# Patient Record
Sex: Male | Born: 1970 | Hispanic: No | Marital: Married
Health system: Southern US, Community
[De-identification: ages and names within clinical notes are randomized; demographics above are authoritative.]

---

## 2020-10-10 ENCOUNTER — Other Ambulatory Visit: Payer: 59

## 2020-10-10 DIAGNOSIS — Z20822 Contact with and (suspected) exposure to covid-19: Secondary | ICD-10-CM

## 2020-10-13 LAB — SARS-COV-2, NAA 2 DAY TAT

## 2020-10-13 LAB — NOVEL CORONAVIRUS, NAA: SARS-CoV-2, NAA: DETECTED — AB

## 2021-02-14 ENCOUNTER — Ambulatory Visit: Payer: Self-pay

## 2021-02-14 ENCOUNTER — Ambulatory Visit (INDEPENDENT_AMBULATORY_CARE_PROVIDER_SITE_OTHER): Payer: 59 | Admitting: Orthopaedic Surgery

## 2021-02-14 ENCOUNTER — Other Ambulatory Visit: Payer: Self-pay

## 2021-02-14 DIAGNOSIS — M79671 Pain in right foot: Secondary | ICD-10-CM

## 2021-02-14 MED ORDER — MELOXICAM 7.5 MG PO TABS: 7.5000 mg | ORAL_TABLET | Freq: Two times a day (BID) | ORAL | 2 refills | Status: AC | PRN

## 2021-02-14 MED ORDER — NITROGLYCERIN 0.2 MG/HR TD PT24: MEDICATED_PATCH | TRANSDERMAL | 12 refills | Status: AC

## 2021-02-14 NOTE — Progress Notes (Signed)
Office Visit Note   Patient: Steve Martin           Date of Birth: 12/25/1970           MRN: [id number] Visit Date: 02/14/2021              Requested by: No referring provider defined for this encounter. PCP: No primary care provider on file.   Assessment & Plan: Visit Diagnoses:  1. Pain of right heel     Plan: Based on findings impression is chronic right insertional Achilles tendinopathy.  Symptoms mainly seem to correlate with running and overactivity.  Fortunately no real pain with normal walking or daily activities.  He is able to wear regular shoes without problems.  We talked about conservative management and he would like to try meloxicam, nitroglycerin patches, home stretches and exercises.  Pennsaid sample was provided today.  I showed him pictures of night splints that he may want to get from [employer].  Follow-Up Instructions: Return if symptoms worsen or fail to improve.   Orders:  Orders Placed This Encounter  Procedures  . XR Os Calcis Right   Meds ordered this encounter  Medications  . nitroGLYCERIN (NITRODUR - DOSED IN MG/24 HR) 0.2 mg/hr patch    Sig: Apply 1/4 patch to area daily prn    Dispense:  5 patch    Refill:  12  . meloxicam (MOBIC) 7.5 MG tablet    Sig: Take 1 tablet (7.5 mg total) by mouth 2 (two) times daily as needed for pain.    Dispense:  30 tablet    Refill:  2      Procedures: No procedures performed   Clinical Data: No additional findings.   Subjective: Chief Complaint  Patient presents with  . Other     Right heel and hip pain    Patient is 50 year old gentleman with chronic right heel pain worse after running and exercising for about 2 years.  He is the father of Bland Rudzinski who is a Producer, television/film/video at [employer].  This is better with rest.  He runs about 5 to 7 miles each time a couple times a week.  The pain is directly posterior that is worse first thing in the morning after running the prior day.  Denies any  injuries.  He does have swelling.  Pain seems to be relatively mild or nonexistent with daily activities.  He denies any pain to touch   Review of Systems   Objective: Vital Signs: There were no vitals taken for this visit.  Physical Exam  Ortho Exam Right heel shows swelling and tenderness to the insertion of the calcaneus consistent with insertional calcaneal tendinopathy.  He has good strength to ankle plantarflexion.  Good passive ankle dorsiflexion.  Posterior tibial tendon, tarsal tunnel, plantar fascial nontender.  Specialty Comments:  No specialty comments available.  Imaging: XR Os Calcis Right  Result Date: 02/14/2021 Calcaneal spur consistent with chronic insertional achilles tendinopathy    PMFS History: There are no problems to display for this patient.  No past medical history on file.  No family history on file.   Social History   Occupational History  . Not on file  Tobacco Use  . Smoking status: Not on file  . Smokeless tobacco: Not on file  Substance and Sexual Activity  . Alcohol use: Not on file  . Drug use: Not on file  . Sexual activity: Not on file

## 2021-12-15 ENCOUNTER — Ambulatory Visit (INDEPENDENT_AMBULATORY_CARE_PROVIDER_SITE_OTHER): Payer: 59

## 2021-12-15 ENCOUNTER — Other Ambulatory Visit: Payer: Self-pay

## 2021-12-15 ENCOUNTER — Encounter: Payer: Self-pay | Admitting: Orthopaedic Surgery

## 2021-12-15 ENCOUNTER — Ambulatory Visit (INDEPENDENT_AMBULATORY_CARE_PROVIDER_SITE_OTHER): Payer: 59 | Admitting: Orthopaedic Surgery

## 2021-12-15 DIAGNOSIS — M545 Low back pain, unspecified: Secondary | ICD-10-CM

## 2021-12-15 DIAGNOSIS — M25551 Pain in right hip: Secondary | ICD-10-CM

## 2021-12-15 MED ORDER — PREDNISONE 10 MG (21) PO TBPK: ORAL_TABLET | ORAL | 3 refills | Status: AC

## 2021-12-15 MED ORDER — TRAMADOL HCL 50 MG PO TABS: 50.0000 mg | ORAL_TABLET | Freq: Every evening | ORAL | 0 refills | Status: AC | PRN

## 2021-12-15 NOTE — Progress Notes (Signed)
? ?Office Visit Note ?  ?Patient: Steve Martin           ?Date of Birth: 1971-08-25           ?MRN: [id number] ?Visit Date: 12/15/2021 ?             ?Requested by: No referring provider defined for this encounter. ?PCP: No primary care provider on file. ? ? ?Assessment & Plan: ?Visit Diagnoses:  ?1. Low back pain, unspecified back pain laterality, unspecified chronicity, unspecified whether sciatica present   ?2. Pain in right hip   ? ? ?Plan: Based on findings unclear etiology as to the buttock pain.  Likely this is muscular and overuse in nature.  He may have a separate groin strain or hip flexor tendinitis either from overactivity or compensation from the buttock pain.  Based on options we will try prednisone Dosepak and relative rest.  I sent in a small supply of tramadol to take for more severe pain at nighttime.  He will let me know if he does not feel any improvement. ? ?Follow-Up Instructions: No follow-ups on file.  ? ?Orders:  ?Orders Placed This Encounter  ?Procedures  ? XR HIP UNILAT W OR W/O PELVIS 2-3 VIEWS RIGHT  ? XR Lumbar Spine 2-3 Views  ? ?Meds ordered this encounter  ?Medications  ? predniSONE (STERAPRED UNI-PAK 21 TAB) 10 MG (21) TBPK tablet  ?  Sig: Take as directed  ?  Dispense:  21 tablet  ?  Refill:  3  ? traMADol (ULTRAM) 50 MG tablet  ?  Sig: Take 1-2 tablets (50-100 mg total) by mouth at bedtime as needed.  ?  Dispense:  10 tablet  ?  Refill:  0  ? ? ? ? Procedures: ?No procedures performed ? ? ?Clinical Data: ?No additional findings. ? ? ?Subjective: ?Chief Complaint  ?Patient presents with  ? Right Hip - Pain  ? Lower Back - Pain  ? ? ?HPI ? ?Patient is a very pleasant 51 year old gentleman who I know socially who comes in for right leg pain for about 6 weeks.  He denies any true back pain.  He states that the majority of the pain is in the muscular buttock region that will radiate down the lateral aspect of the thigh to the knee when he is driving a car.  Occasionally it radiates into  the groin but it feels muscular and it feels like a separate issue.  Denies any injuries.  PCP placed him on Celebrex for about 3 weeks which he only takes at night and he feels like there is some temporary relief.  He does not feel like this is getting better.  Denies any numbness and tingling. ? ?Review of Systems  ?Constitutional: Negative.   ?All other systems reviewed and are negative. ? ? ?Objective: ?Vital Signs: There were no vitals taken for this visit. ? ?Physical Exam ?Vitals and nursing note reviewed.  ?Constitutional:   ?   Appearance: He is well-developed.  ?Pulmonary:  ?   Effort: Pulmonary effort is normal.  ?Abdominal:  ?   Palpations: Abdomen is soft.  ?Skin: ?   General: Skin is warm.  ?Neurological:  ?   Mental Status: He is alert and oriented to person, place, and time.  ?Psychiatric:     ?   Behavior: Behavior normal.     ?   Thought Content: Thought content normal.     ?   Judgment: Judgment normal.  ? ? ?Ortho Exam ? ?  Examination of the right hip shows excellent range of motion.  He does have some groin pain with FADIR test and Pearlean Brownie.  Axial compression is negative.  No pain in the SI joint with FABER.  Lateral hip is nontender.  No sciatic tension signs.  He has some slight groin pain with resisted straight leg test.  Slight groin pain with external rotation logroll.  Lumbar spine shows normal range of motion and no tenderness or palpable defects or abnormalities. ? ?Specialty Comments:  ?No specialty comments available. ? ?Imaging: ?XR HIP UNILAT W OR W/O PELVIS 2-3 VIEWS RIGHT ? ?Result Date: 12/15/2021 ?Mild superior acetabular spurring.  Well-preserved joint space. ? ?XR Lumbar Spine 2-3 Views ? ?Result Date: 12/15/2021 ?No acute or structural abnormalities.  Mild lumbar spondylosis.  Nothing significantly degenerative.  ? ? ?PMFS History: ?There are no problems to display for this patient. ? ?History reviewed. No pertinent past medical history.  ?History reviewed. No pertinent family  history.  ?History reviewed. No pertinent surgical history. ?Social History  ? ?Occupational History  ? Not on file  ?Tobacco Use  ? Smoking status: Not on file  ? Smokeless tobacco: Not on file  ?Substance and Sexual Activity  ? Alcohol use: Not on file  ? Drug use: Not on file  ? Sexual activity: Not on file  ? ? ? ? ? ? ?

## 2021-12-26 ENCOUNTER — Ambulatory Visit: Payer: 59 | Admitting: Orthopaedic Surgery

## 2022-01-25 ENCOUNTER — Other Ambulatory Visit: Payer: Self-pay

## 2022-01-25 DIAGNOSIS — M25551 Pain in right hip: Secondary | ICD-10-CM

## 2022-01-25 DIAGNOSIS — M545 Low back pain, unspecified: Secondary | ICD-10-CM

## 2022-02-03 ENCOUNTER — Ambulatory Visit
Admission: RE | Admit: 2022-02-03 | Discharge: 2022-02-03 | Disposition: A | Discharge status: Home / Self Care | Payer: 59 | Source: Ambulatory Visit | Attending: Orthopaedic Surgery | Admitting: Orthopaedic Surgery

## 2022-02-03 DIAGNOSIS — M25551 Pain in right hip: Secondary | ICD-10-CM

## 2022-02-03 DIAGNOSIS — M545 Low back pain, unspecified: Secondary | ICD-10-CM

## 2022-02-03 IMAGING — MR MR LUMBAR SPINE W/O CM
4 of 5 series · 27 of 48 positions shown · non-contrast
Comparison: Lumbar spine x-ray [DATE]

CLINICAL DATA: Right hip and back pain.  Ongoing for 4 months.

EXAM:
MRI LUMBAR SPINE WITHOUT CONTRAST
TECHNIQUE: Multiplanar, multisequence MR imaging of the lumbar spine was
performed. No intravenous contrast was administered.

[Series 2: T2 · sagittal · 4.0mm · 1.09mm/px · 6 of 17 slices shown (1 of 2)]
[im 1/17]
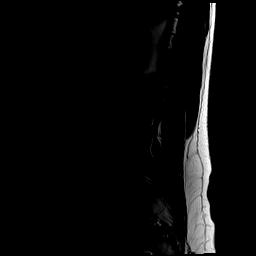
[im 4/17]
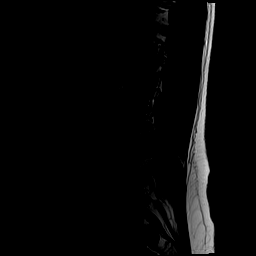
[im 7/17]
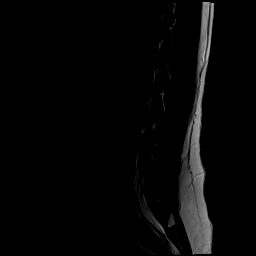
[im 10/17]
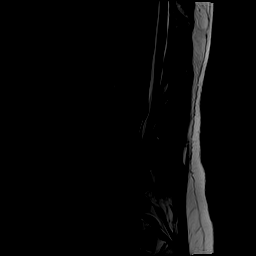
[im 13/17]
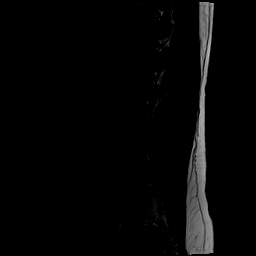
[im 17/17]
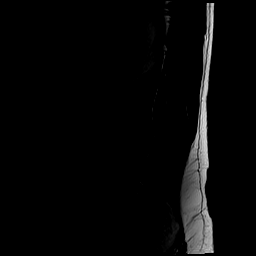

[Series 4: T1 · sagittal · 4.0mm · 1.09mm/px · 6 of 17 slices shown (1 of 2)]
[im 1/17]
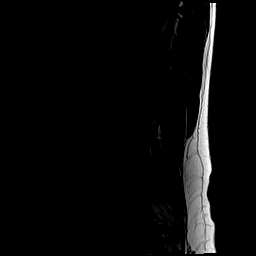
[im 4/17]
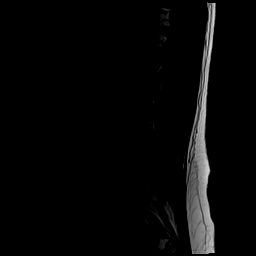
[im 7/17]
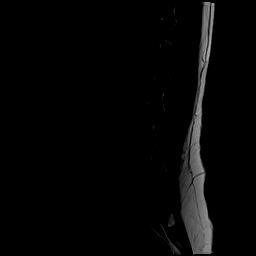
[im 10/17]
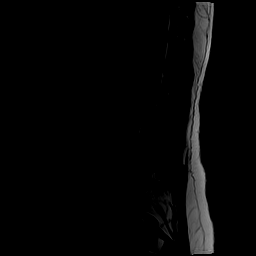
[im 13/17]
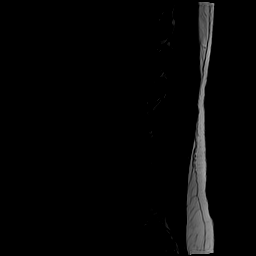
[im 17/17]
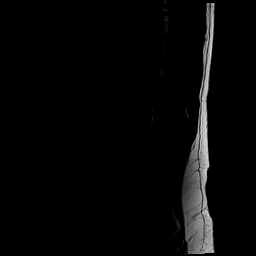

[Series 5: T2 · axial · 4.0mm · 0.39mm/px · z∈[-53,+167]mm · 9 of 42 slices shown (2 of 2)]
[im 1/42]
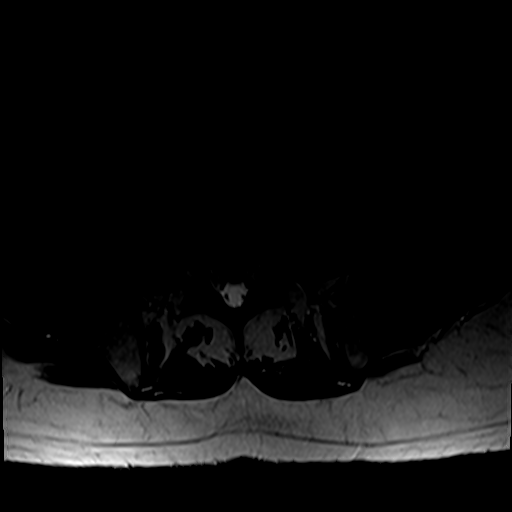
[im 6/42]
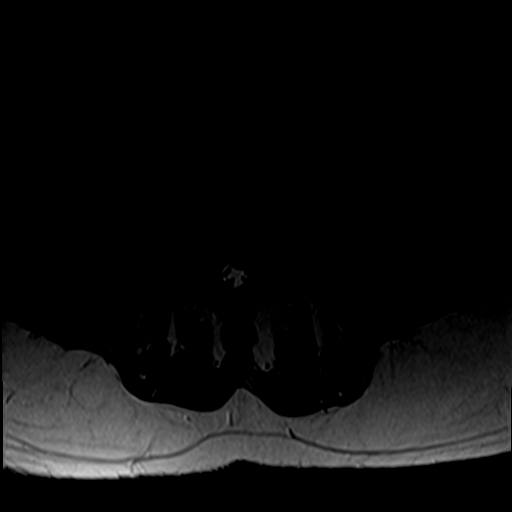
[im 12/42]
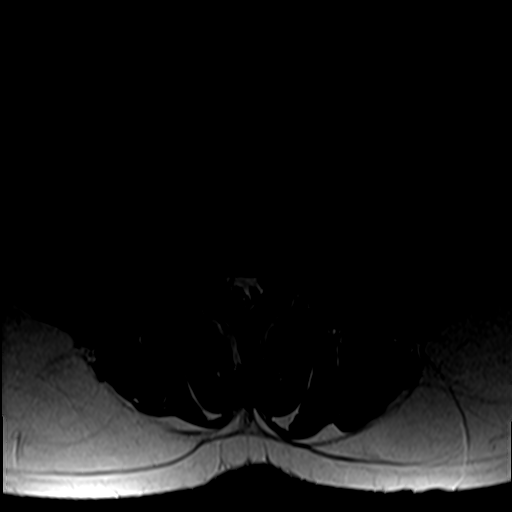
[im 18/42]
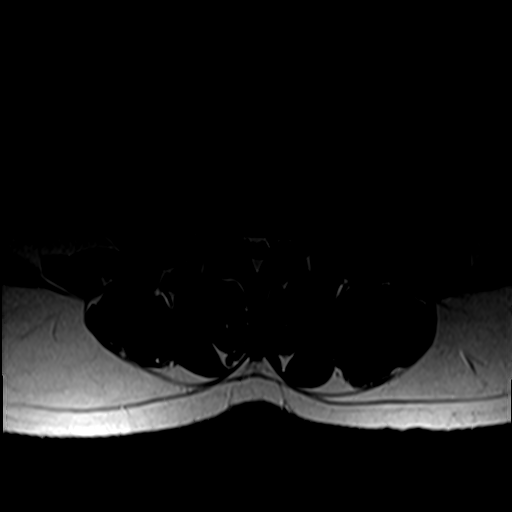
[im 21/42]
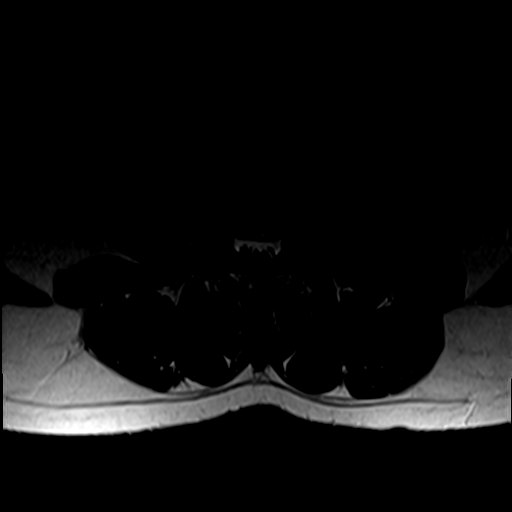
[im 24/42]
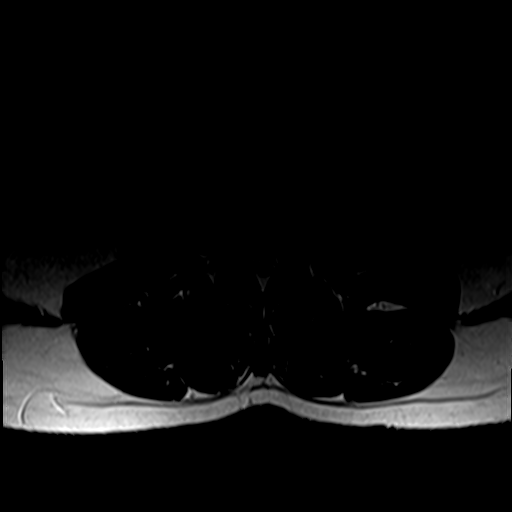
[im 30/42]
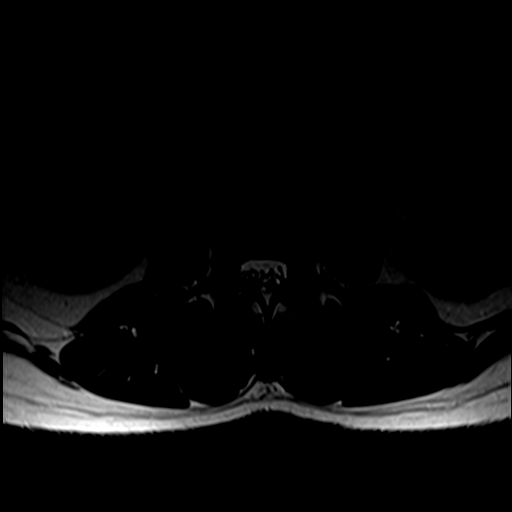
[im 36/42]
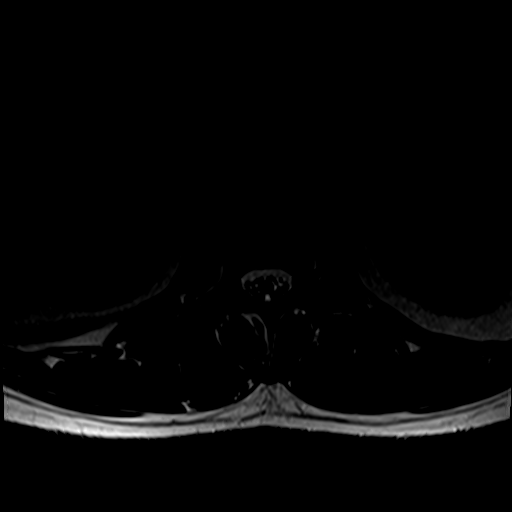
[im 42/42]
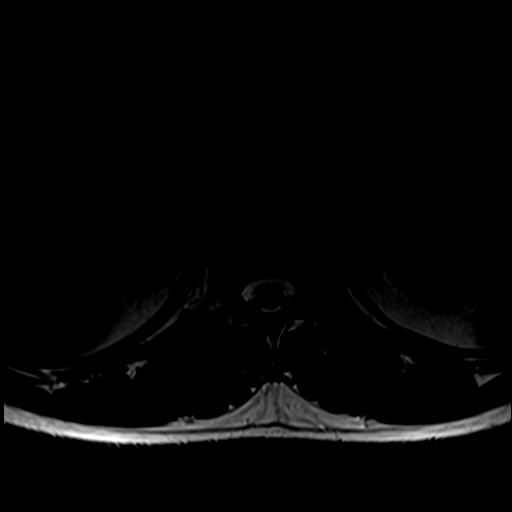

[Series 6: T1 · axial · 4.0mm · 0.39mm/px · z∈[-53,+138]mm · 6 of 42 slices shown (2 of 2)]
[im 1/42]
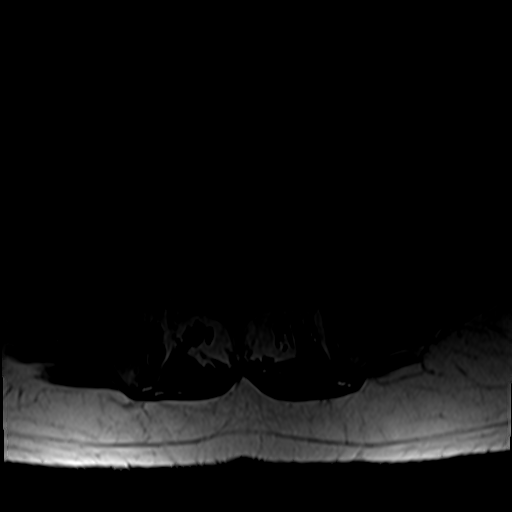
[im 6/42]
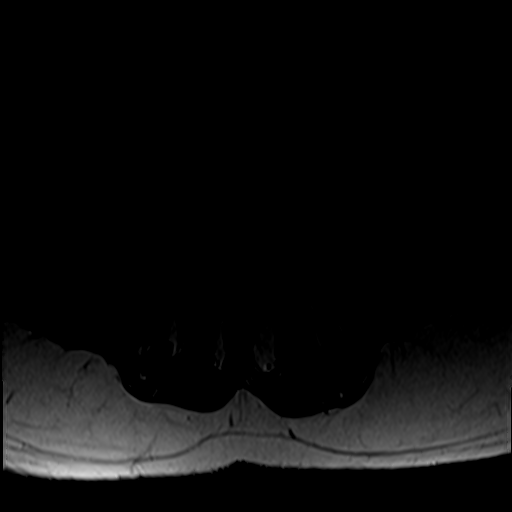
[im 12/42]
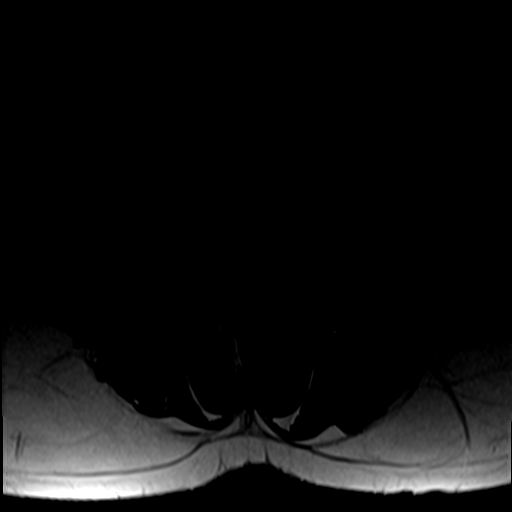
[im 18/42]
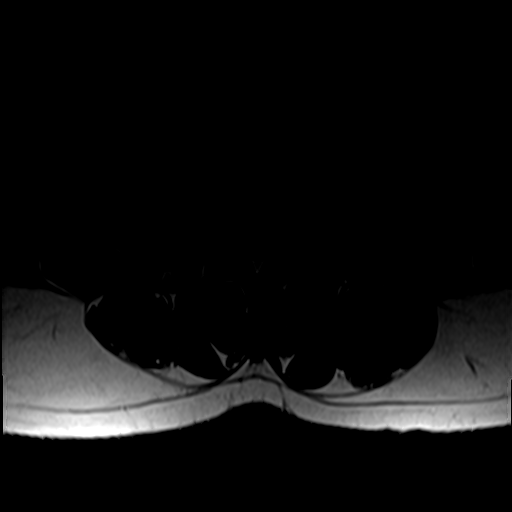
[im 21/42]
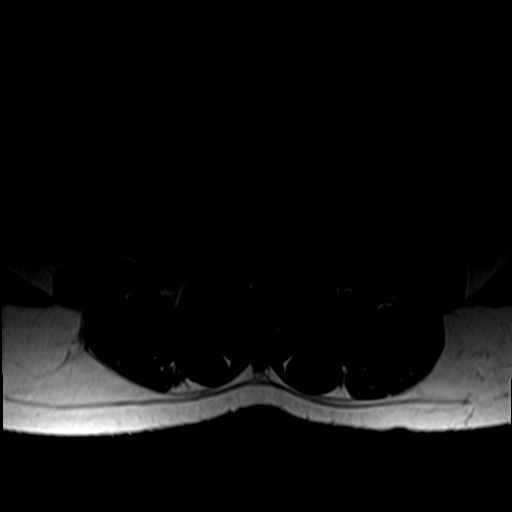
[im 36/42]
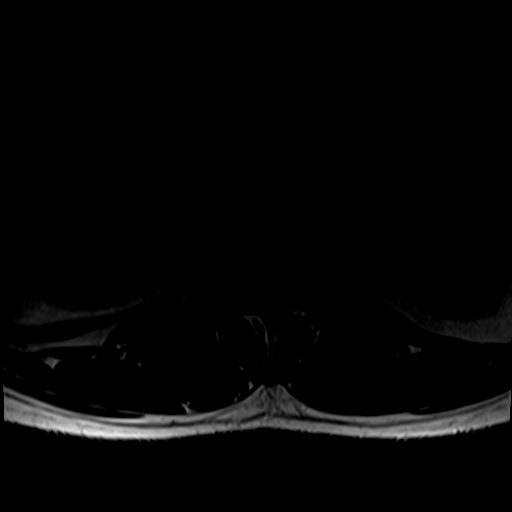

[27 of 48 positions shown; findings below may reference images not displayed]

FINDINGS: Segmentation:  Standard.

Alignment:  2 mm retrolisthesis of L5 on S1.

Vertebrae: No acute fracture, evidence of discitis, or aggressive
bone lesion.

Conus medullaris and cauda equina: Conus extends to the L1-2 level.
Conus and cauda equina appear normal.

Paraspinal and other soft tissues: No acute paraspinal abnormality.

Disc levels:

Disc spaces: Degenerative disease with disc height loss at L4-5 and
L5-S1.

T12-L1: No significant disc bulge. No neural foraminal stenosis. No
central canal stenosis.

L1-L2: No significant disc bulge. No neural foraminal stenosis. No
central canal stenosis.

L2-L3: Mild broad-based disc bulge. No foraminal stenosis. Mild
spinal stenosis.

L3-L4: Mild broad-based disc bulge. Mild bilateral facet
arthropathy. No foraminal or central canal stenosis.

L4-L5: No significant disc bulge. No neural foraminal stenosis. No
central canal stenosis. Mild bilateral facet arthropathy.

L5-S1: Broad-based disc bulge with a broad left paracentral disc
protrusion with mass effect on the left intraspinal S1 nerve root.
Mild bilateral facet arthropathy. Mild left foraminal stenosis. No
right foraminal stenosis. No spinal stenosis.
IMPRESSION: 1. At L5-S1 there is a broad-based disc bulge with a broad left
paracentral disc protrusion with mass effect on the left intraspinal
S1 nerve root. Mild bilateral facet arthropathy. Mild left foraminal
stenosis. No right foraminal stenosis.
2.  No acute osseous injury of the lumbar spine.

## 2022-02-03 IMAGING — MR MR HIP*R* W/O CM
6 series · 37 of 40 positions shown · non-contrast
Comparison: None Available.

CLINICAL DATA: Low back pain, right hip pain for 4 months

EXAM:
MR OF THE RIGHT HIP WITHOUT CONTRAST
TECHNIQUE: Multiplanar, multisequence MR imaging was performed. No intravenous
contrast was administered.

[Series 3: T1 · coronal · 4.0mm · 1.19mm/px · 3 of 24 slices shown]
[im 1/24]
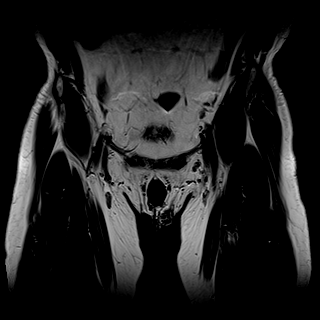
[im 5/24]
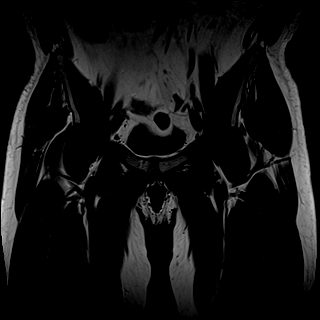
[im 10/24]
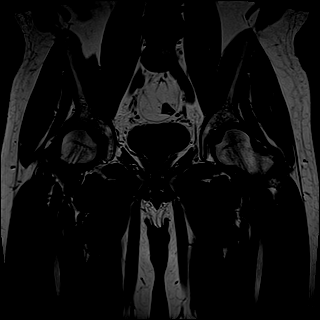

[Series 4: T2 fat-sat · coronal · 4.0mm · 1.19mm/px · 7 of 24 slices shown (1 of 2)]
[im 1/24]
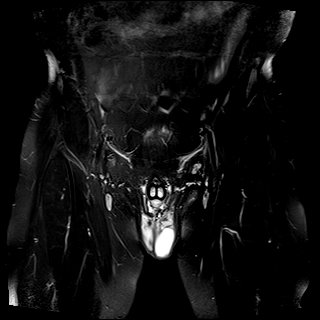
[im 4/24]
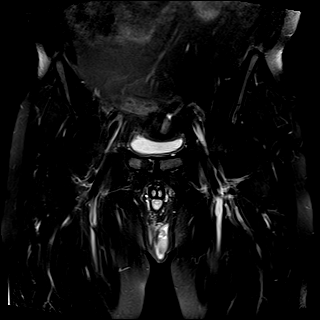
[im 8/24]
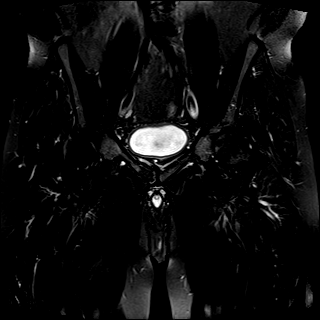
[im 12/24]
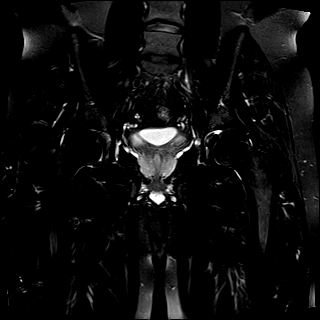
[im 16/24]
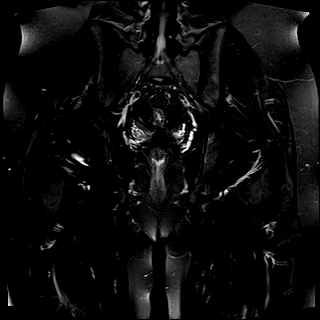
[im 20/24]
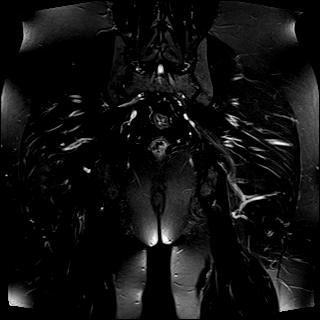
[im 24/24]
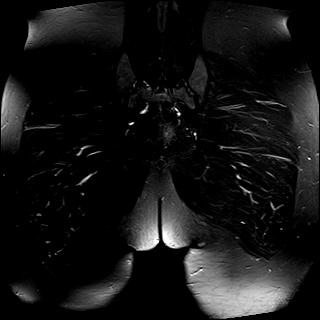

[Series 5: T2 fat-sat · axial · 4.0mm · 0.62mm/px · z∈[-78,+57]mm · 8 of 29 slices shown (2 of 2)]
[im 1/29]
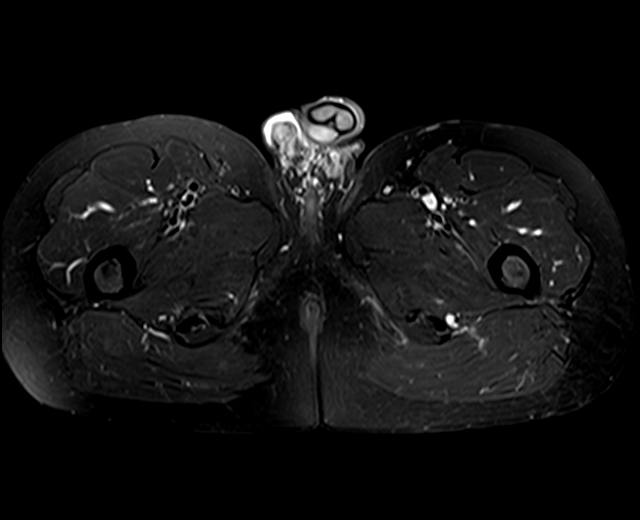
[im 5/29]
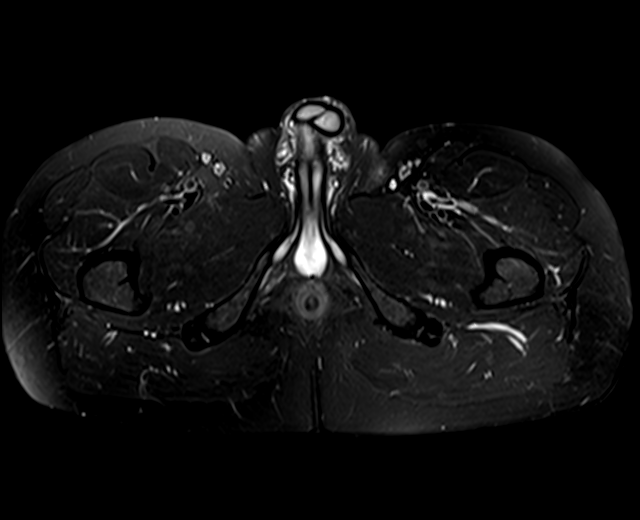
[im 9/29]
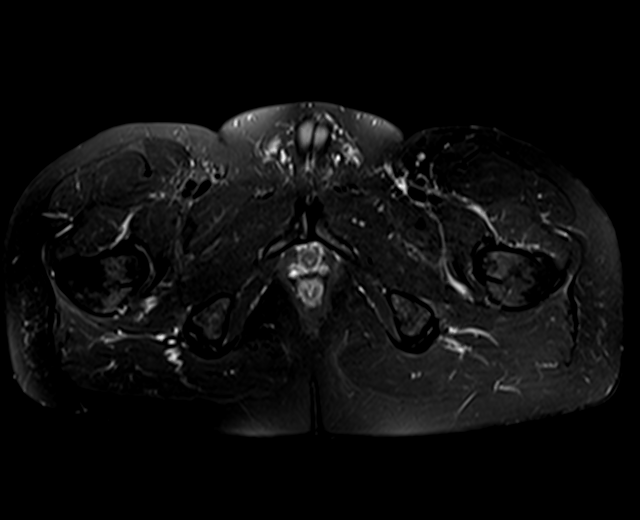
[im 13/29]
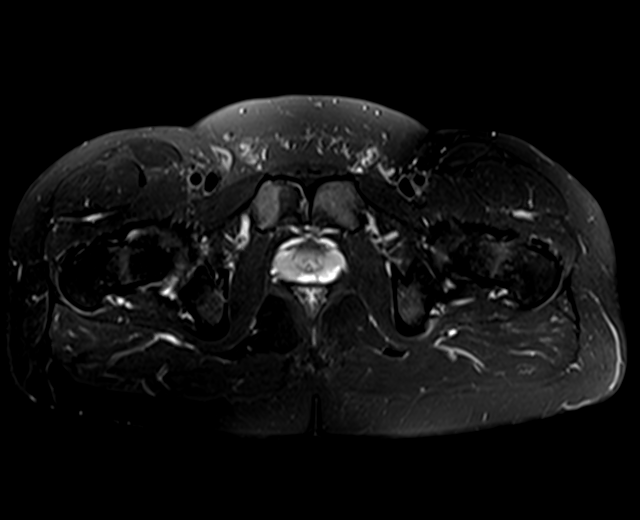
[im 17/29]
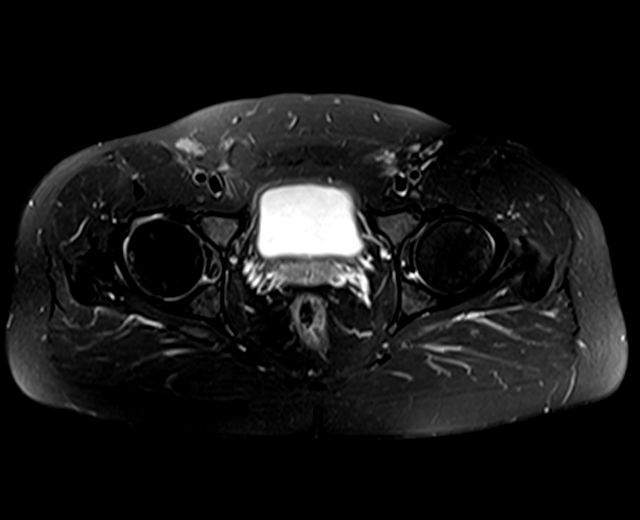
[im 21/29]
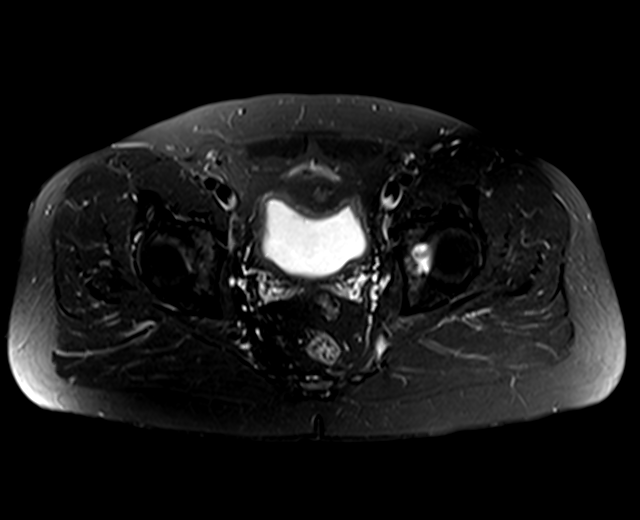
[im 25/29]
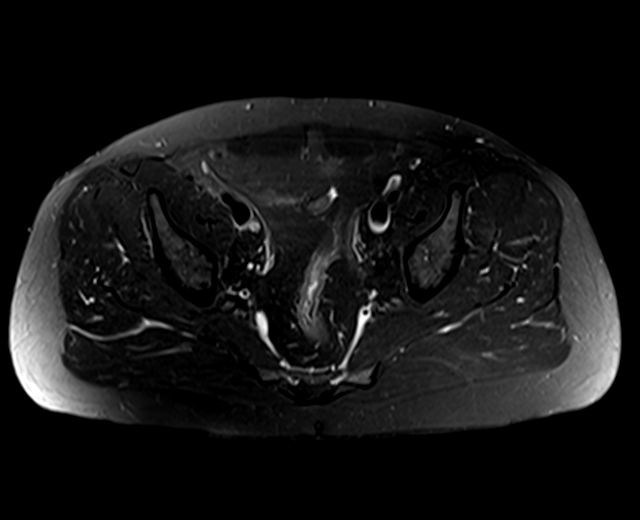
[im 29/29]
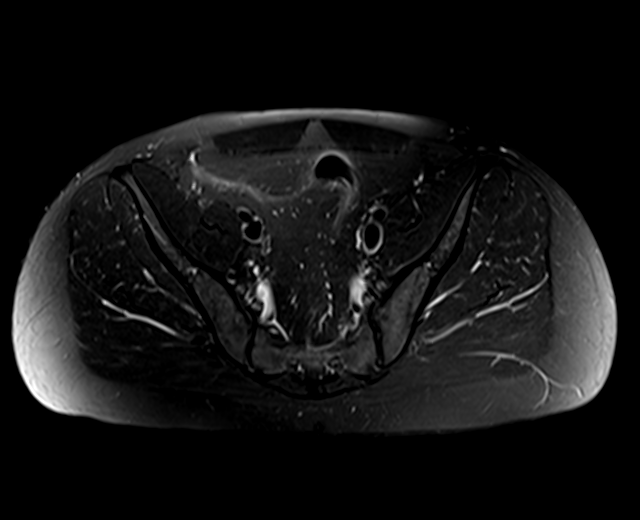

[Series 6: PD fat-sat · sagittal · 4.0mm · 0.70mm/px · 7 of 26 slices shown (1 of 3)]
[im 1/26]
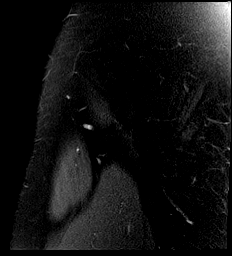
[im 5/26]
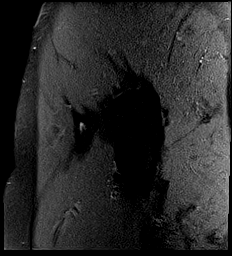
[im 9/26]
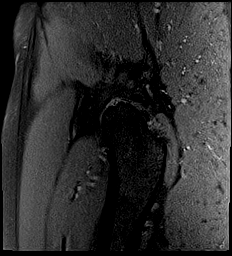
[im 13/26]
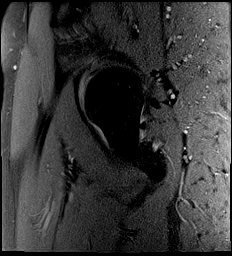
[im 17/26]
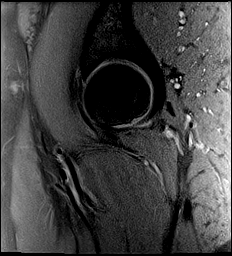
[im 21/26]
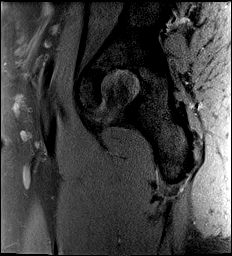
[im 26/26]
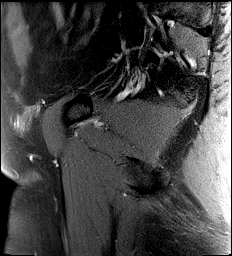

[Series 7: PD fat-sat · coronal · 4.0mm · 0.70mm/px · 5 of 19 slices shown (2 of 3)]
[im 1/19]
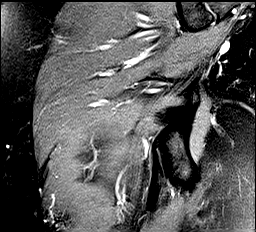
[im 5/19]
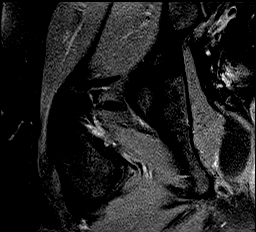
[im 10/19]
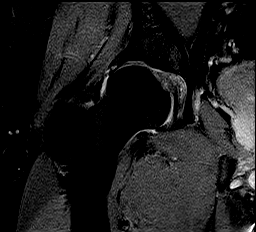
[im 14/19]
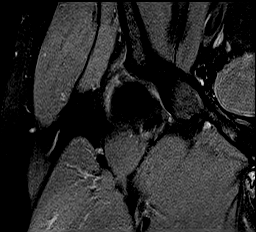
[im 19/19]
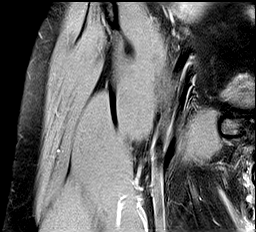

[Series 8: PD fat-sat · oblique · 4.0mm · 0.70mm/px · 7 of 24 slices shown (3 of 3)]
[im 1/24]
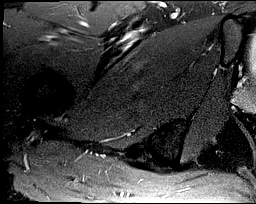
[im 4/24]
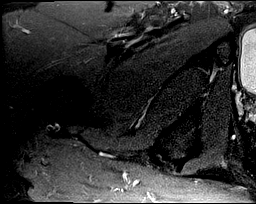
[im 8/24]
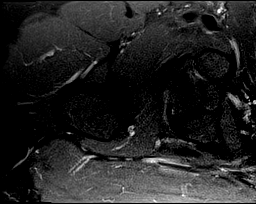
[im 12/24]
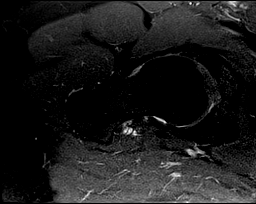
[im 16/24]
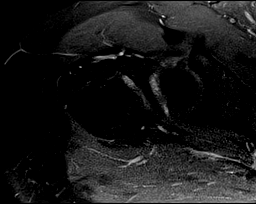
[im 20/24]
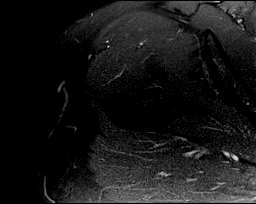
[im 24/24]
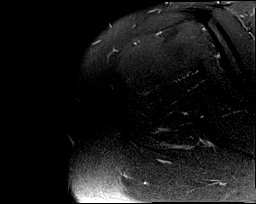

[37 of 40 positions shown; findings below may reference images not displayed]

FINDINGS: Bones:

No hip fracture, dislocation or avascular necrosis.

No periosteal reaction or bone destruction. No aggressive osseous
lesion.

Normal sacrum and sacroiliac joints. No SI joint widening or erosive
changes.

Lumbar spine is better evaluated on the MRI of the lumbar spine
performed earlier same day and reported separately.

Mild subchondral edema and articular surface irregularity involving
the right side of the pubic symphysis as can be seen with osteitis
pubis. No joint fluid in the pubic symphysis.

Articular cartilage and labrum

Articular cartilage: Mild partial-thickness cartilage loss of the
right femoral head and acetabulum.

Labrum: Right labral degeneration with a tear of the superior and
anterior labrum.

Joint or bursal effusion

Joint effusion:  No hip joint effusion.  No SI joint effusion.

Bursae:  No bursa formation.

Muscles and tendons

Flexors: Normal.

Extensors: Normal.

Abductors: Normal.

Adductors: Normal.

Gluteals: Normal.

Hamstrings: Normal.

Other findings

No pelvic free fluid. No fluid collection or hematoma. No inguinal
lymphadenopathy. No inguinal hernia.
IMPRESSION: 1. Mild partial-thickness cartilage loss of the right femoral head
and acetabulum.
2. Right labral degeneration with a tear of the superior and
anterior labrum.
3. Mild subchondral edema and articular surface irregularity
involving the right side of the pubic symphysis as can be seen with
osteitis pubis.

## 2022-02-04 NOTE — Progress Notes (Signed)
Needs appt to review MRI and reevaluation

## 2022-02-07 ENCOUNTER — Ambulatory Visit (INDEPENDENT_AMBULATORY_CARE_PROVIDER_SITE_OTHER): Payer: 59 | Admitting: Orthopaedic Surgery

## 2022-02-07 ENCOUNTER — Encounter: Payer: Self-pay | Admitting: Orthopaedic Surgery

## 2022-02-07 DIAGNOSIS — M25551 Pain in right hip: Secondary | ICD-10-CM | POA: Insufficient documentation

## 2022-02-07 MED ORDER — IBUPROFEN 800 MG PO TABS: 800.0000 mg | ORAL_TABLET | Freq: Three times a day (TID) | ORAL | 2 refills | Status: AC | PRN

## 2022-02-07 NOTE — Progress Notes (Signed)
? ?  Office Visit Note ?  ?Patient: Steve Martin           ?Date of Birth: 10-16-1970           ?MRN: [id number] ?Visit Date: 02/07/2022 ?             ?Requested by: No referring provider defined for this encounter. ?PCP: No primary care provider on file. ? ? ?Assessment & Plan: ?Visit Diagnoses:  ?1. Pain in right hip   ? ? ?Plan: Patient returns today to discuss right hip and lumbar spine MRI. ? ?Examination of the right hip shows pain that is localized to the gluteus maximus and the pain is reproducible with hip extension against gravity.  I cannot reproduce the pain with examination of the hip.  No pain with circumduction.  No sciatic tension signs. ? ?The MRI studies were reviewed with Steve Martin in detail and I feel that his symptoms are related to a muscle strain or overuse.  I do not think that the arthritis or the labral degeneration is responsible for his symptoms nor do I think that his lumbar spine is the cause.  He states that overall the symptoms have improved since January and they briefly improved with both prednisone Dosepaks.  He feels relief from 200 mg ibuprofen which helps some get through the night.  I have recommended 2 weeks of scheduled 800 mg ibuprofen.  Activity modification recommended as well.  Follow-up as needed. ? ?Follow-Up Instructions: No follow-ups on file.  ? ?Orders:  ?No orders of the defined types were placed in this encounter. ? ?Meds ordered this encounter  ?Medications  ? ibuprofen (ADVIL) 800 MG tablet  ?  Sig: Take 1 tablet (800 mg total) by mouth every 8 (eight) hours as needed.  ?  Dispense:  30 tablet  ?  Refill:  2  ? ? ? ? Procedures: ?No procedures performed ? ? ?Clinical Data: ?No additional findings. ? ? ?Subjective: ?Chief Complaint  ?Patient presents with  ? Right Hip - Follow-up  ? Lower Back - Follow-up  ? ? ?HPI ? ?Review of Systems ? ? ?Objective: ?Vital Signs: There were no vitals taken for this visit. ? ?Physical Exam ? ?Ortho Exam ? ?Specialty Comments:  ?No  specialty comments available. ? ?Imaging: ?No results found. ? ? ?PMFS History: ?Patient Active Problem List  ? Diagnosis Date Noted  ? Pain in right hip 02/07/2022  ? ?History reviewed. No pertinent past medical history.  ?History reviewed. No pertinent family history.  ?History reviewed. No pertinent surgical history. ?Social History  ? ?Occupational History  ? Not on file  ?Tobacco Use  ? Smoking status: Not on file  ? Smokeless tobacco: Not on file  ?Substance and Sexual Activity  ? Alcohol use: Not on file  ? Drug use: Not on file  ? Sexual activity: Not on file  ? ? ? ? ? ? ?

## 2022-03-02 ENCOUNTER — Telehealth: Payer: Self-pay | Admitting: Orthopaedic Surgery

## 2022-03-02 NOTE — Telephone Encounter (Signed)
Received vm from Beverlee Nims w/ [facility] requesting notes after 5/7. I faxed [phone]. Ph [phone] ext [phone]

## 2022-03-07 ENCOUNTER — Ambulatory Visit (INDEPENDENT_AMBULATORY_CARE_PROVIDER_SITE_OTHER): Payer: 59 | Admitting: Orthopaedic Surgery

## 2022-03-07 ENCOUNTER — Encounter: Payer: Self-pay | Admitting: Orthopaedic Surgery

## 2022-03-07 DIAGNOSIS — M25551 Pain in right hip: Secondary | ICD-10-CM | POA: Diagnosis not present

## 2022-03-07 NOTE — Progress Notes (Signed)
   Office Visit Note   Patient: Steve Martin           Date of Birth: 04-06-1971           MRN: [id number] Visit Date: 03/07/2022              Requested by: No referring provider defined for this encounter. PCP: Pcp, No   Assessment & Plan: Visit Diagnoses:  1. Pain in right hip     Plan: Based on findings I think his hip is a more likely culprit for his symptoms therefore I would like to send him to [facility] for ultrasound-guided cortisone injection of the right hip joint which will hopefully be therapeutic and diagnostic.  He will take it easy for couple weeks after the injection and then increase activity as tolerated.  Again I reviewed his MRI of his hip and lumbar spine and I feel that his hip is more likely the source of the problems.  He will contact me if he does not feel any relief from the injection.  Follow-Up Instructions: No follow-ups on file.   Orders:  No orders of the defined types were placed in this encounter.  No orders of the defined types were placed in this encounter.     Procedures: No procedures performed   Clinical Data: No additional findings.   Subjective: Chief Complaint  Patient presents with   Right Hip - Pain    HPI Steve Martin returns today for ongoing right hip pain.  It slightly better after taking the scheduled ibuprofen but still bothersome after a period of immobility such as sitting or lying down.  He has pain in the buttock and groin as well.  Occasionally there is pain that runs down the leg.  Review of Systems   Objective: Vital Signs: There were no vitals taken for this visit.  Physical Exam  Ortho Exam Examination of the right hip shows well-preserved range of motion.  Lateral hip is nontender.  No sciatic tension signs. Specialty Comments:  No specialty comments available.  Imaging: No results found.   PMFS History: Patient Active Problem List   Diagnosis Date Noted   Pain in right hip 02/07/2022    History reviewed. No pertinent past medical history.  No family history on file.  History reviewed. No pertinent surgical history. Social History   Occupational History   Not on file  Tobacco Use   Smoking status: Not on file   Smokeless tobacco: Not on file  Substance and Sexual Activity   Alcohol use: Not on file   Drug use: Not on file   Sexual activity: Not on file

## 2022-03-09 ENCOUNTER — Telehealth: Payer: Self-pay | Admitting: Orthopaedic Surgery

## 2022-03-09 NOTE — Telephone Encounter (Signed)
Pt called and was wondering if you could request a hip injection through his insurance

## 2022-03-09 NOTE — Telephone Encounter (Signed)
Patient will contact [facility] about preapproval since they will do the injection.

## 2022-03-09 NOTE — Telephone Encounter (Signed)
Patient was scheduled at [facility] on 03/16/2022 for the hip injection. Patient called and cancelled appointment because he wants it preapproved.

## 2022-03-16 ENCOUNTER — Ambulatory Visit: Payer: 59 | Admitting: Family Medicine

## 2022-12-06 ENCOUNTER — Other Ambulatory Visit: Payer: Self-pay | Admitting: Orthopaedic Surgery
